# Patient Record
Sex: Female | Born: 1965 | Race: White | Hispanic: No | Marital: Married | Smoking: Never smoker
Health system: Southern US, Community
[De-identification: ages and names within clinical notes are randomized; demographics above are authoritative.]

## PROBLEM LIST (undated history)

## (undated) DIAGNOSIS — F329 Major depressive disorder, single episode, unspecified: Secondary | ICD-10-CM

## (undated) DIAGNOSIS — F419 Anxiety disorder, unspecified: Secondary | ICD-10-CM

## (undated) DIAGNOSIS — Z87440 Personal history of urinary (tract) infections: Secondary | ICD-10-CM

## (undated) DIAGNOSIS — N200 Calculus of kidney: Secondary | ICD-10-CM

## (undated) DIAGNOSIS — F32A Depression, unspecified: Secondary | ICD-10-CM

## (undated) DIAGNOSIS — G709 Myoneural disorder, unspecified: Secondary | ICD-10-CM

## (undated) DIAGNOSIS — M2550 Pain in unspecified joint: Secondary | ICD-10-CM

## (undated) DIAGNOSIS — R51 Headache: Secondary | ICD-10-CM

## (undated) HISTORY — PX: KNEE ARTHROSCOPY: SUR90

## (undated) HISTORY — PX: ABDOMINAL HYSTERECTOMY: SHX81

## (undated) HISTORY — PX: BREAST SURGERY: SHX581

---

## 1989-09-06 HISTORY — PX: CHOLECYSTECTOMY: SHX55

## 1990-09-06 DIAGNOSIS — G709 Myoneural disorder, unspecified: Secondary | ICD-10-CM

## 1990-09-06 HISTORY — DX: Myoneural disorder, unspecified: G70.9

## 2013-04-23 ENCOUNTER — Other Ambulatory Visit: Payer: Self-pay | Admitting: Anesthesiology

## 2013-04-25 ENCOUNTER — Encounter (HOSPITAL_COMMUNITY): Payer: Self-pay

## 2013-04-25 ENCOUNTER — Encounter (HOSPITAL_COMMUNITY)
Admission: RE | Admit: 2013-04-25 | Discharge: 2013-04-25 | Disposition: A | Discharge status: Home / Self Care | Payer: Managed Care, Other (non HMO) | Source: Ambulatory Visit | Attending: Anesthesiology | Admitting: Anesthesiology

## 2013-04-25 HISTORY — DX: Anxiety disorder, unspecified: F41.9

## 2013-04-25 HISTORY — DX: Pain in unspecified joint: M25.50

## 2013-04-25 HISTORY — DX: Headache: R51

## 2013-04-25 HISTORY — DX: Major depressive disorder, single episode, unspecified: F32.9

## 2013-04-25 HISTORY — DX: Myoneural disorder, unspecified: G70.9

## 2013-04-25 HISTORY — DX: Personal history of urinary (tract) infections: Z87.440

## 2013-04-25 HISTORY — DX: Depression, unspecified: F32.A

## 2013-04-25 HISTORY — DX: Calculus of kidney: N20.0

## 2013-04-25 LAB — BASIC METABOLIC PANEL
BUN: 13 mg/dL (ref 6–23)
CO2: 30 mEq/L (ref 19–32)
Calcium: 9.4 mg/dL (ref 8.4–10.5)
Chloride: 99 mEq/L (ref 96–112)
Creatinine, Ser: 0.64 mg/dL (ref 0.50–1.10)
GFR calc Af Amer: >90 mL/min (ref >90)
GFR calc non Af Amer: >90 mL/min (ref >90)
Glucose, Bld: 86 mg/dL (ref 70–99)
Potassium: 4.1 mEq/L (ref 3.5–5.1)
Sodium: 137 mEq/L (ref 135–145)

## 2013-04-25 LAB — SURGICAL PCR SCREEN
MRSA, PCR: NEGATIVE
Staphylococcus aureus: NEGATIVE

## 2013-04-25 LAB — CBC
HCT: 38.1 % (ref 36.0–46.0)
Hemoglobin: 13.3 g/dL (ref 12.0–15.0)
MCH: 30.9 pg (ref 26.0–34.0)
MCHC: 34.9 g/dL (ref 30.0–36.0)
MCV: 88.6 fL (ref 78.0–100.0)
Platelets: 198 10*3/uL (ref 150–400)
RBC: 4.3 MIL/uL (ref 3.87–5.11)
RDW: 12.5 % (ref 11.5–15.5)
WBC: 4.9 10*3/uL (ref 4.0–10.5)

## 2013-04-25 NOTE — Pre-Procedure Instructions (Signed)
Ashley Mcmahon  04/25/2013   Your procedure is scheduled on:  April 27, 2013 at 7:30 AM  Report to [facility] at 5:30 AM.  Call this number if you have problems the morning of surgery: [phone]   Remember:   Do not eat food or drink liquids after midnight.   Take these medicines the morning of surgery with A SIP OF WATER: Prozac, Xanax and Hydrocodone Stop all vitamins, herbal medications and any non-steroidals (Ibuprofen, Aleve) today.   Do not wear jewelry, make-up or nail polish.  Do not wear lotions, powders, or perfumes. You may wear deodorant.  Do not shave 48 hours prior to surgery. Men may shave face and neck.  Do not bring valuables to the hospital.  [facility] is not responsible                   for any belongings or valuables.  Contacts, dentures or bridgework may not be worn into surgery.  Leave suitcase in the car. After surgery it may be brought to your room.  For patients admitted to the hospital, checkout time is 11:00 AM the day of  discharge.   Patients discharged the day of surgery will not be allowed to drive  home.  Name and phone number of your driver: Nena Polio  Special Instructions: Shower using CHG 2 nights before surgery and the night before surgery.  If you shower the day of surgery use CHG.  Use special wash - you have one bottle of CHG for all showers.  You should use approximately 1/3 of the bottle for each shower.   Please read over the following fact sheets that you were given: Pain Booklet, Coughing and Deep Breathing, MRSA Information and Surgical Site Infection Prevention

## 2013-04-26 MED ORDER — CEFAZOLIN SODIUM-DEXTROSE 2-3 GM-% IV SOLR
2.0000 g | INTRAVENOUS | Status: AC
  Administered 2013-04-27: 2 g via INTRAVENOUS
  Filled 2013-04-26: qty 50

## 2013-04-26 NOTE — H&P (Signed)
Ashley Mcmahon is an 47 y.o. female.   Chief Complaint: pain in left lower extremity, CRPS HPI:47 year old with history of MS, but also CRPS of left foot/lower extremity. Failed interventional therapy after years of previous benefit, multiple medication trials. Some concern for being unable to MRI image head to evaluate MS, but with SCS now having limited MRI capability, and location of patient's pain favorable for lower thoracic placement of leads, patient underwent SCS trial with excellent results, including >50% improvement in pain control, and increased functionality. Now coming to OR for permanent implantation.   Previous placement of trial lead was as follows:  16 contact linear Infionion lead was introduced and seen to advance into the posterior elements of the epidural space.  Under live AP fluoroscopic guidance the lead was advanced until its distal 2 distal contacts overlay T10  with the rest of the contacts distributed across the T11 and T12 vertebral bodies.  The  lead was essentially midline.  Initial trials  resulted in good coverage using a variety of contacts, amplitude changes, pulse width manipulation to cover all the areas of the patient's pain.    Past Medical History  Diagnosis Date  . Neuromuscular disorder 1992    Multiple Sclerosis  . Depression     on Prozac  . Anxiety     on Xanax  . Kidney stone   . History of recurrent UTIs     has botox injections in bladder every 6-9 months  . Headache(784.0)     migraines, takes Excedrin Migraine as needed  . Joint pain     Past Surgical History  Procedure Laterality Date  . Abdominal hysterectomy    . Breast surgery Left     breast biopsy  . Cholecystectomy  1991  . Knee arthroscopy Right     No family history on file. Social History:  reports that she has never smoked. She has never used smokeless tobacco. She reports that  drinks alcohol. She reports that she does not use illicit drugs.  Allergies:  Allergies   Allergen Reactions  . Bactrim [Sulfamethoxazole-Tmp Ds] Hives  . Other Nausea Only    AGENT:  Coconut  . Sulfa Antibiotics Hives    No prescriptions prior to admission    Results for orders placed during the hospital encounter of 04/25/13 (from the past 48 hour(s))  SURGICAL PCR SCREEN     Status: None   Collection Time    04/25/13  8:51 AM      Result Value Range   MRSA, PCR NEGATIVE  NEGATIVE   Staphylococcus aureus NEGATIVE  NEGATIVE   Comment:            The Xpert SA Assay (FDA     approved for NASAL specimens     in patients over 69 years of age),     is one component of     a comprehensive surveillance     program.  Test performance has     been validated by [employer] for patients greater     than or equal to 4 year old.     It is not intended     to diagnose infection nor to     guide or monitor treatment.  CBC     Status: None   Collection Time    04/25/13  9:01 AM      Result Value Range   WBC 4.9  4.0 - 10.5 K/uL   RBC  4.30  3.87 - 5.11 MIL/uL   Hemoglobin 13.3  12.0 - 15.0 g/dL   HCT 08.6  57.8 - 46.9 %   MCV 88.6  78.0 - 100.0 fL   MCH 30.9  26.0 - 34.0 pg   MCHC 34.9  30.0 - 36.0 g/dL   RDW 62.9  52.8 - 41.3 %   Platelets 198  150 - 400 K/uL  BASIC METABOLIC PANEL     Status: None   Collection Time    04/25/13  9:02 AM      Result Value Range   Sodium 137  135 - 145 mEq/L   Potassium 4.1  3.5 - 5.1 mEq/L   Chloride 99  96 - 112 mEq/L   CO2 30  19 - 32 mEq/L   Glucose, Bld 86  70 - 99 mg/dL   BUN 13  6 - 23 mg/dL   Creatinine, Ser 2.44  0.50 - 1.10 mg/dL   Calcium 9.4  8.4 - 01.0 mg/dL   GFR calc non Af Amer >90  >90 mL/min   GFR calc Af Amer >90  >90 mL/min   Comment: (NOTE)     The eGFR has been calculated using the CKD EPI equation.     This calculation has not been validated in all clinical situations.     eGFR's persistently <90 mL/min signify possible Chronic Kidney     Disease.   No results found.  Review of Systems   Constitutional: Negative.   HENT: Negative.   Eyes: Negative.   Cardiovascular: Negative.   Gastrointestinal: Negative.   Genitourinary: Negative.   Musculoskeletal: Negative.   Skin: Negative.   Neurological: Negative.   Endo/Heme/Allergies: Negative.   Psychiatric/Behavioral: Negative.     There were no vitals taken for this visit. Physical Exam  Constitutional: She is oriented to person, place, and time. She appears well-developed and well-nourished.  HENT:  Head: Normocephalic and atraumatic.  Eyes: EOM are normal. Pupils are equal, round, and reactive to light.  Cardiovascular: Normal rate and regular rhythm.   Neurological: She is alert and oriented to person, place, and time.  Skin: Skin is warm and dry.  Psychiatric: She has a normal mood and affect. Her behavior is normal. Judgment and thought content normal.     Assessment/Plan A: CRPS left lower extremity, mild symptoms right PLAN: permanent Infinion/Spectra SCS implantation.   Gwynne Edinger 04/27/2013, 7:18 AM

## 2013-04-27 ENCOUNTER — Encounter (HOSPITAL_COMMUNITY)
Admission: RE | Disposition: A | Discharge status: Home / Self Care | Payer: Self-pay | Source: Ambulatory Visit | Attending: Anesthesiology

## 2013-04-27 ENCOUNTER — Ambulatory Visit (HOSPITAL_COMMUNITY)
Admission: RE | Admit: 2013-04-27 | Discharge: 2013-04-27 | Disposition: A | Discharge status: Home / Self Care | Payer: Managed Care, Other (non HMO) | Source: Ambulatory Visit | Attending: Anesthesiology | Admitting: Anesthesiology

## 2013-04-27 ENCOUNTER — Ambulatory Visit (HOSPITAL_COMMUNITY): Payer: Managed Care, Other (non HMO)

## 2013-04-27 ENCOUNTER — Ambulatory Visit (HOSPITAL_COMMUNITY): Payer: Managed Care, Other (non HMO) | Admitting: Certified Registered Nurse Anesthetist

## 2013-04-27 ENCOUNTER — Encounter (HOSPITAL_COMMUNITY): Payer: Self-pay | Admitting: Certified Registered Nurse Anesthetist

## 2013-04-27 ENCOUNTER — Encounter (HOSPITAL_COMMUNITY): Payer: Self-pay | Admitting: *Deleted

## 2013-04-27 DIAGNOSIS — F3289 Other specified depressive episodes: Secondary | ICD-10-CM | POA: Insufficient documentation

## 2013-04-27 DIAGNOSIS — Z87440 Personal history of urinary (tract) infections: Secondary | ICD-10-CM | POA: Insufficient documentation

## 2013-04-27 DIAGNOSIS — Z882 Allergy status to sulfonamides status: Secondary | ICD-10-CM | POA: Insufficient documentation

## 2013-04-27 DIAGNOSIS — G35 Multiple sclerosis: Secondary | ICD-10-CM | POA: Insufficient documentation

## 2013-04-27 DIAGNOSIS — G589 Mononeuropathy, unspecified: Secondary | ICD-10-CM | POA: Insufficient documentation

## 2013-04-27 DIAGNOSIS — F329 Major depressive disorder, single episode, unspecified: Secondary | ICD-10-CM | POA: Insufficient documentation

## 2013-04-27 DIAGNOSIS — F411 Generalized anxiety disorder: Secondary | ICD-10-CM | POA: Insufficient documentation

## 2013-04-27 DIAGNOSIS — G43909 Migraine, unspecified, not intractable, without status migrainosus: Secondary | ICD-10-CM | POA: Insufficient documentation

## 2013-04-27 DIAGNOSIS — M129 Arthropathy, unspecified: Secondary | ICD-10-CM | POA: Insufficient documentation

## 2013-04-27 DIAGNOSIS — Z91018 Allergy to other foods: Secondary | ICD-10-CM | POA: Insufficient documentation

## 2013-04-27 HISTORY — PX: SPINAL CORD STIMULATOR INSERTION: SHX5378

## 2013-04-27 LAB — PROTIME-INR
INR: 0.98 (ref 0.00–1.49)
Prothrombin Time: 12.8 seconds (ref 11.6–15.2)

## 2013-04-27 LAB — APTT: aPTT: 29 seconds (ref 24–37)

## 2013-04-27 SURGERY — INSERTION, SPINAL CORD STIMULATOR, LUMBAR
Anesthesia: Monitor Anesthesia Care | Site: Back | Wound class: Clean

## 2013-04-27 MED ORDER — MIDAZOLAM HCL 5 MG/5ML IJ SOLN
INTRAMUSCULAR | Status: DC | PRN
  Administered 2013-04-27: 2 mg via INTRAVENOUS

## 2013-04-27 MED ORDER — HYDROCODONE-ACETAMINOPHEN 10-325 MG PO TABS: 1.0000 | ORAL_TABLET | Freq: Four times a day (QID) | ORAL | Status: DC | PRN

## 2013-04-27 MED ORDER — 0.9 % SODIUM CHLORIDE (POUR BTL) OPTIME
TOPICAL | Status: DC | PRN
  Administered 2013-04-27: 1000 mL

## 2013-04-27 MED ORDER — LACTATED RINGERS IV SOLN
INTRAVENOUS | Status: DC | PRN
  Administered 2013-04-27 (×2): via INTRAVENOUS

## 2013-04-27 MED ORDER — FENTANYL CITRATE 0.05 MG/ML IJ SOLN
INTRAMUSCULAR | Status: DC | PRN
  Administered 2013-04-27: 25 ug via INTRAVENOUS

## 2013-04-27 MED ORDER — LACTATED RINGERS IV SOLN: INTRAVENOUS | Status: DC

## 2013-04-27 MED ORDER — CEPHALEXIN 500 MG PO CAPS: 500.0000 mg | ORAL_CAPSULE | Freq: Four times a day (QID) | ORAL | Status: DC

## 2013-04-27 MED ORDER — HYDROMORPHONE HCL PF 1 MG/ML IJ SOLN
INTRAMUSCULAR | Status: AC
  Filled 2013-04-27: qty 1

## 2013-04-27 MED ORDER — PROPOFOL INFUSION 10 MG/ML OPTIME
INTRAVENOUS | Status: DC | PRN
  Administered 2013-04-27: 120 ug/kg/min via INTRAVENOUS

## 2013-04-27 MED ORDER — BUPIVACAINE-EPINEPHRINE (PF) 0.5% -1:200000 IJ SOLN
INTRAMUSCULAR | Status: DC | PRN
  Administered 2013-04-27: 22 mL

## 2013-04-27 MED ORDER — ONDANSETRON HCL 4 MG/2ML IJ SOLN: 4.0000 mg | Freq: Once | INTRAMUSCULAR | Status: DC | PRN

## 2013-04-27 MED ORDER — EPHEDRINE SULFATE 50 MG/ML IJ SOLN
INTRAMUSCULAR | Status: DC | PRN
  Administered 2013-04-27: 10 mg via INTRAVENOUS

## 2013-04-27 MED ORDER — HYDROMORPHONE HCL PF 1 MG/ML IJ SOLN
0.2500 mg | INTRAMUSCULAR | Status: DC | PRN
  Administered 2013-04-27: 0.5 mg via INTRAVENOUS

## 2013-04-27 MED ORDER — SODIUM CHLORIDE 0.9 % IR SOLN
Status: DC | PRN
  Administered 2013-04-27: 08:00:00

## 2013-04-27 SURGICAL SUPPLY — 65 items
BAG DECANTER FOR FLEXI CONT (MISCELLANEOUS) ×2 IMPLANT
BENZOIN TINCTURE PRP APPL 2/3 (GAUZE/BANDAGES/DRESSINGS) IMPLANT
BINDER ABD UNIV 12 45-62 (WOUND CARE) IMPLANT
BINDER ABDOMINAL 46IN 62IN (WOUND CARE)
BLADE SURG ROTATE 9660 (MISCELLANEOUS) IMPLANT
CABLE/EXTENSION OR 1X16 61 (CABLE) ×4 IMPLANT
CHLORAPREP W/TINT 26ML (MISCELLANEOUS) ×2 IMPLANT
CLOTH BEACON ORANGE TIMEOUT ST (SAFETY) ×2 IMPLANT
CONT SPEC 4OZ CLIKSEAL STRL BL (MISCELLANEOUS) ×2 IMPLANT
DERMABOND ADHESIVE PROPEN (GAUZE/BANDAGES/DRESSINGS) ×1
DERMABOND ADVANCED .7 DNX6 (GAUZE/BANDAGES/DRESSINGS) ×1 IMPLANT
DRAPE C-ARM 42X72 X-RAY (DRAPES) ×2 IMPLANT
DRAPE C-ARMOR (DRAPES) ×2 IMPLANT
DRAPE LAPAROTOMY 100X72X124 (DRAPES) ×2 IMPLANT
DRAPE POUCH INSTRU U-SHP 10X18 (DRAPES) ×2 IMPLANT
DRAPE SURG 17X23 STRL (DRAPES) ×2 IMPLANT
DRESSING TELFA 8X3 (GAUZE/BANDAGES/DRESSINGS) IMPLANT
DRSG COVADERM 4X8 (GAUZE/BANDAGES/DRESSINGS) ×4 IMPLANT
DRSG OPSITE 4X5.5 SM (GAUZE/BANDAGES/DRESSINGS) ×4 IMPLANT
ELECT REM PT RETURN 9FT ADLT (ELECTROSURGICAL) ×2
ELECTRODE REM PT RTRN 9FT ADLT (ELECTROSURGICAL) ×1 IMPLANT
GAUZE SPONGE 4X4 16PLY XRAY LF (GAUZE/BANDAGES/DRESSINGS) ×4 IMPLANT
GLOVE BIOGEL PI IND STRL 7.0 (GLOVE) ×1 IMPLANT
GLOVE BIOGEL PI INDICATOR 7.0 (GLOVE) ×1
GLOVE ECLIPSE 7.5 STRL STRAW (GLOVE) ×2 IMPLANT
GLOVE EXAM NITRILE LRG STRL (GLOVE) IMPLANT
GLOVE EXAM NITRILE MD LF STRL (GLOVE) IMPLANT
GLOVE EXAM NITRILE XL STR (GLOVE) IMPLANT
GLOVE EXAM NITRILE XS STR PU (GLOVE) IMPLANT
GLOVE SURG SS PI 7.0 STRL IVOR (GLOVE) ×4 IMPLANT
GOWN BRE IMP SLV AUR LG STRL (GOWN DISPOSABLE) IMPLANT
GOWN BRE IMP SLV AUR XL STRL (GOWN DISPOSABLE) ×4 IMPLANT
GOWN STRL REIN 2XL LVL4 (GOWN DISPOSABLE) IMPLANT
IPG PRECISION SPECTRA (Stimulator) ×2 IMPLANT
KIT BASIN OR (CUSTOM PROCEDURE TRAY) ×2 IMPLANT
KIT CHARGING (KITS) ×1
KIT CHARGING PRECISION NEURO (KITS) ×1 IMPLANT
KIT REMOTE CONTROL PRECISION (KITS) ×2 IMPLANT
KIT ROOM TURNOVER OR (KITS) ×2 IMPLANT
KIT SPLITTER 30CM 2X8 (Stimulator) ×4 IMPLANT
LEAD KIT CONTACT INFINION 16 (Stimulator) ×4 IMPLANT
LOOP VESSEL MAXI BLUE (MISCELLANEOUS) ×2 IMPLANT
LOOP VESSEL MINI RED (MISCELLANEOUS) ×2 IMPLANT
MEDICAL ADHESIVE ×2 IMPLANT
NEEDLE 18GX1X1/2 (RX/OR ONLY) (NEEDLE) IMPLANT
NEEDLE HYPO 25X1 1.5 SAFETY (NEEDLE) ×2 IMPLANT
NS IRRIG 1000ML POUR BTL (IV SOLUTION) ×2 IMPLANT
PACK LAMINECTOMY NEURO (CUSTOM PROCEDURE TRAY) ×2 IMPLANT
PAD ARMBOARD 7.5X6 YLW CONV (MISCELLANEOUS) ×2 IMPLANT
SPONGE LAP 4X18 X RAY DECT (DISPOSABLE) ×2 IMPLANT
SPONGE SURGIFOAM ABS GEL SZ50 (HEMOSTASIS) IMPLANT
STAPLER SKIN PROX WIDE 3.9 (STAPLE) IMPLANT
STRIP CLOSURE SKIN 1/2X4 (GAUZE/BANDAGES/DRESSINGS) IMPLANT
SUT MNCRL AB 3-0 PS2 18 (SUTURE) IMPLANT
SUT MNCRL AB 4-0 PS2 18 (SUTURE) ×4 IMPLANT
SUT SILK 0 (SUTURE) ×2
SUT SILK 0 MO-6 18XCR BRD 8 (SUTURE) ×2 IMPLANT
SUT SILK 0 TIES 10X30 (SUTURE) IMPLANT
SUT SILK 2 0 TIES 10X30 (SUTURE) ×2 IMPLANT
SUT VIC AB 2-0 CP2 18 (SUTURE) ×4 IMPLANT
SYRINGE 10CC LL (SYRINGE) IMPLANT
TOWEL OR 17X24 6PK STRL BLUE (TOWEL DISPOSABLE) ×2 IMPLANT
TOWEL OR 17X26 10 PK STRL BLUE (TOWEL DISPOSABLE) ×2 IMPLANT
WATER STERILE IRR 1000ML POUR (IV SOLUTION) ×2 IMPLANT
YANKAUER SUCT BULB TIP NO VENT (SUCTIONS) ×2 IMPLANT

## 2013-04-27 NOTE — Op Note (Signed)
PREOP DX: CRPS of the left lower extremity POSTOP DX: CRPS of the left lower extremity PROCEDURES PERFORMED:1) intraop fluoro 2) placement of 2 16 contact [employer] Infinion leads 3) placement of Spectra SCS generator  SURGEON:Mandy Peeks  ASSISTANT: NONE  ANESTHESIA: MAC  EBL: <20cc  DESCRIPTION OF PROCEDURE: After a discussion of risks, benefits and alternatives, informed consent was obtained. The patient was taken to the OR, turned prone onto a Jackson table, all pressure points padded, SCD's placed, and an adequate plane of anesthesia induced. A timeout was taken to verify the correct patient, position, personnel, availability of appropriate equipment, and administration of perioperative antibiotics.  The thoracic and lumbar areas were widely prepped with chloraprep and draped into a sterile field. Fluoroscopy was used to plan a right paramedian incision at the L1-L3 levels, and an incision made with a 10 blade and carried down to the dorsolumbar fascia with the bovie and blunt dissection. Retractors were placed and a 14g [employer] tuohy needle placed into the epidural space at the L1-2  interspace using biplanar fluoro and loss-of-resistance technique. The needle was aspirated without any return of fluid. A [employer] INFINION lead was introduced and under live AP fluoro advanced until the distal-most contact overlay the inferior aspect T8 vertebral body shadow with the rest of the contacts distributed over the T9 and T10 vertebral bodies in a position just at anatomic midline. A second Infinion lead was placed just left of anatomic midline in the same levels using the same technique. The patient was awakened and the leads tested; impedances were good, and the patient reported good coverage with amplitudes in the 3-7 mA range. 0 silk sutures were placed in the fascia adjacent to the needles. The needles and stylets were removed under fluoroscopy with no lead migration noted. Leads  were then fixed to the fascia by chest tube-type fixation into position with the sutures; repeat images were obtained to verify that there had been no lead migration.   The incision was inspected and hemostasis obtained with the bipolar cautery.  Attention was then turned to creation of a subcutaneous pocket. At the left flank, a 3 cm incision was made with a 10 blade and using the bovie and blunt dissection a pocket of size appropriate to place a SCS generator. The pocket was trialed, and found to be of adequate size. The pocket was inspected for hemostasis, which was found to be excellent. Using reverse seldinger technique, the leads were tunneled to the pocket site, and the leads inserted into the SCS generator. Impedances were checked, and all found to be excellent. The leads were then all fixed into position with a self-torquing wrench. The wiring was all carefully coiled, placed behind the generator and placed in the pocket.  Both incisions were copiously irrigated with bacitracin-containing irrigation. The lumbar incision was closed in 2 deep layers of interrupted 2-0 vicryl and the skin closed with a running 3-0 monocryl subcuticular suture, and dermabond. The pocket incision was closed with a deeper layer of 2-0 vicryl interrupted sutures, and the skin closed with running 3-0 monocryl subcuticular suture, and dermabond. Sterile dressings were applied. Needle, sponge, and instrument counts were correct x2 at the end of the case.   The patient was then carefully awakened from anesthesia, turned supine, an abdominal binder placed, and the patient taken to the recovery room where she underwent complex spinal cord stimulator programming.  COMPLICATIONS: NONE  CONDITION: Stable throughout the course of the procedure and immediately afterward  DISPOSITION: discharge to home, with antibiotics and pain medicine. Discussed care with the patient and her husband. Followup in clinic will be scheduled in 10-14  days.

## 2013-04-27 NOTE — Transfer of Care (Signed)
Immediate Anesthesia Transfer of Care Note  Patient: Ashley Mcmahon  Procedure(s) Performed: Procedure(s): LUMBAR SPINAL CORD STIMULATOR INSERTION (N/A)  Patient Location: PACU  Anesthesia Type:MAC  Level of Consciousness: awake, alert , oriented and patient cooperative  Airway & Oxygen Therapy: Patient Spontanous Breathing and Patient connected to nasal cannula oxygen  Post-op Assessment: Report given to PACU RN, Post -op Vital signs reviewed and stable and Patient moving all extremities X 4  Post vital signs: Reviewed and stable  Complications: No apparent anesthesia complications

## 2013-04-27 NOTE — Anesthesia Preprocedure Evaluation (Addendum)
Anesthesia Evaluation  Patient identified by MRN, date of birth, ID band Patient awake    Reviewed: Allergy & Precautions, H&P , NPO status   Airway Mallampati: II      Dental  (+) Teeth Intact and Dental Advisory Given   Pulmonary  breath sounds clear to auscultation        Cardiovascular Rhythm:Regular Rate:Normal     Neuro/Psych  Headaches, Anxiety Depression  Neuromuscular disease    GI/Hepatic   Endo/Other    Renal/GU      Musculoskeletal   Abdominal   Peds  Hematology   Anesthesia Other Findings   Reproductive/Obstetrics                          Anesthesia Physical Anesthesia Plan  ASA: II  Anesthesia Plan: MAC   Post-op Pain Management:    Induction: Intravenous  Airway Management Planned: Natural Airway and Simple Face Mask  Additional Equipment:   Intra-op Plan:   Post-operative Plan:   Informed Consent: I have reviewed the patients History and Physical, chart, labs and discussed the procedure including the risks, benefits and alternatives for the proposed anesthesia with the patient or authorized representative who has indicated his/her understanding and acceptance.   Dental advisory given  Plan Discussed with: CRNA, Anesthesiologist and Surgeon  Anesthesia Plan Comments: (RSD L LE Multiple sclerosis  No obvious sequelae  Plan MAC  Kipp Brood, MD)       Anesthesia Quick Evaluation

## 2013-04-27 NOTE — Preoperative (Signed)
Beta Blockers   Reason not to administer Beta Blockers:Not Applicable 

## 2013-04-27 NOTE — Anesthesia Postprocedure Evaluation (Signed)
  Anesthesia Post-op Note  Patient: Ashley Mcmahon  Procedure(s) Performed: Procedure(s): LUMBAR SPINAL CORD STIMULATOR INSERTION (N/A)  Patient Location: PACU  Anesthesia Type:MAC  Level of Consciousness: awake, alert  and oriented  Airway and Oxygen Therapy: Patient Spontanous Breathing  Post-op Pain: none  Post-op Assessment: Post-op Vital signs reviewed, Patient's Cardiovascular Status Stable, Respiratory Function Stable, Patent Airway and Pain level controlled  Post-op Vital Signs: stable  Complications: No apparent anesthesia complications

## 2013-04-30 ENCOUNTER — Encounter (HOSPITAL_COMMUNITY): Payer: Self-pay | Admitting: Anesthesiology

## 2013-11-30 DIAGNOSIS — G35 Multiple sclerosis: Secondary | ICD-10-CM | POA: Insufficient documentation

## 2016-01-08 ENCOUNTER — Other Ambulatory Visit: Payer: Self-pay | Admitting: Psychiatry

## 2016-01-08 DIAGNOSIS — I050 Rheumatic mitral stenosis: Secondary | ICD-10-CM

## 2017-11-16 DIAGNOSIS — N3946 Mixed incontinence: Secondary | ICD-10-CM | POA: Insufficient documentation

## 2017-11-16 DIAGNOSIS — N319 Neuromuscular dysfunction of bladder, unspecified: Secondary | ICD-10-CM | POA: Insufficient documentation

## 2018-02-02 DIAGNOSIS — N3281 Overactive bladder: Secondary | ICD-10-CM | POA: Insufficient documentation

## 2019-11-20 ENCOUNTER — Encounter (HOSPITAL_COMMUNITY): Payer: Self-pay | Admitting: Emergency Medicine

## 2019-11-20 ENCOUNTER — Emergency Department (HOSPITAL_COMMUNITY)
Admission: EM | Admit: 2019-11-20 | Discharge: 2019-11-21 | Discharge status: Against Medical Advice | Payer: 59 | Attending: Emergency Medicine | Admitting: Emergency Medicine

## 2019-11-20 ENCOUNTER — Emergency Department (HOSPITAL_COMMUNITY): Payer: 59

## 2019-11-20 ENCOUNTER — Other Ambulatory Visit: Payer: Self-pay

## 2019-11-20 DIAGNOSIS — Z5321 Procedure and treatment not carried out due to patient leaving prior to being seen by health care provider: Secondary | ICD-10-CM | POA: Insufficient documentation

## 2019-11-20 DIAGNOSIS — R002 Palpitations: Secondary | ICD-10-CM | POA: Diagnosis not present

## 2019-11-20 DIAGNOSIS — R42 Dizziness and giddiness: Secondary | ICD-10-CM | POA: Diagnosis not present

## 2019-11-20 DIAGNOSIS — M79603 Pain in arm, unspecified: Secondary | ICD-10-CM | POA: Diagnosis not present

## 2019-11-20 LAB — BASIC METABOLIC PANEL
Anion gap: 14 (ref 5–15)
BUN: 26 mg/dL — ABNORMAL HIGH (ref 6–20)
CO2: 23 mmol/L (ref 22–32)
Calcium: 9.6 mg/dL (ref 8.9–10.3)
Chloride: 100 mmol/L (ref 98–111)
Creatinine, Ser: 0.91 mg/dL (ref 0.44–1.00)
GFR calc Af Amer: >60 mL/min (ref >60)
GFR calc non Af Amer: >60 mL/min (ref >60)
Glucose, Bld: 93 mg/dL (ref 70–99)
Potassium: 3.7 mmol/L (ref 3.5–5.1)
Sodium: 137 mmol/L (ref 135–145)

## 2019-11-20 LAB — CBC
HCT: 38.8 % (ref 36.0–46.0)
Hemoglobin: 13.7 g/dL (ref 12.0–15.0)
MCH: 30.4 pg (ref 26.0–34.0)
MCHC: 35.3 g/dL (ref 30.0–36.0)
MCV: 86 fL (ref 80.0–100.0)
Platelets: 319 10*3/uL (ref 150–400)
RBC: 4.51 MIL/uL (ref 3.87–5.11)
RDW: 11.4 % — ABNORMAL LOW (ref 11.5–15.5)
WBC: 9.9 10*3/uL (ref 4.0–10.5)
nRBC: 0 % (ref 0.0–0.2)

## 2019-11-20 LAB — TROPONIN I (HIGH SENSITIVITY): Troponin I (High Sensitivity): 4 ng/L (ref <18)

## 2019-11-20 MED ORDER — SODIUM CHLORIDE 0.9% FLUSH: 3.0000 mL | Freq: Once | INTRAVENOUS | Status: DC

## 2019-11-20 NOTE — ED Triage Notes (Signed)
Pt arrives to ED from home with complaints of sudden right arm pain/numbness, back pain, right rib pain and dizziness starting today at 10am. Patient states she doesn't "fee right, something is off."

## 2019-11-20 NOTE — ED Notes (Signed)
Pt. Was called multiple times, 5x no response, moved OTF.

## 2020-03-11 ENCOUNTER — Ambulatory Visit (INDEPENDENT_AMBULATORY_CARE_PROVIDER_SITE_OTHER): Payer: No Typology Code available for payment source | Admitting: Podiatry

## 2020-03-11 ENCOUNTER — Ambulatory Visit (INDEPENDENT_AMBULATORY_CARE_PROVIDER_SITE_OTHER): Payer: No Typology Code available for payment source

## 2020-03-11 ENCOUNTER — Encounter: Payer: Self-pay | Admitting: Podiatry

## 2020-03-11 ENCOUNTER — Other Ambulatory Visit: Payer: Self-pay | Admitting: Podiatry

## 2020-03-11 ENCOUNTER — Other Ambulatory Visit: Payer: Self-pay

## 2020-03-11 DIAGNOSIS — G90523 Complex regional pain syndrome I of lower limb, bilateral: Secondary | ICD-10-CM | POA: Diagnosis not present

## 2020-03-11 DIAGNOSIS — M79671 Pain in right foot: Secondary | ICD-10-CM | POA: Diagnosis not present

## 2020-03-11 DIAGNOSIS — M778 Other enthesopathies, not elsewhere classified: Secondary | ICD-10-CM

## 2020-03-11 DIAGNOSIS — M7752 Other enthesopathy of left foot: Secondary | ICD-10-CM

## 2020-03-11 NOTE — Progress Notes (Signed)
Subjective:  Patient ID: Ashley Mcmahon, female    DOB: March 27, 1966,  MRN: [id number] HPI Chief Complaint  Patient presents with  . Foot Pain    Bilateral feet (L>R) - "crunching bones" type feeling, very sore with walking, skin is very sensitive, worse wtih barefoot, has CRPS in left foot, has stimulator x 7 years, but unsure if helping anymore, both feet seem to be affected now, swelling, worsened over the last 2 years  . New Patient (Initial Visit)    54 y.o. female presents with the above complaint.   ROS: Denies fever chills nausea vomiting muscle aches pains calf pain back pain chest pain shortness of breath.  She does relate a history of CRPS with nerve stimulator bilateral legs states that the nerve stimulator appears to not be working as well as it once did.  States that she has pain beneath the hallux interphalangeal joint right foot that feels the way her CRPS started several years ago.  Past Medical History:  Diagnosis Date  . Anxiety    on Xanax  . Depression    on Prozac  . Headache(784.0)    migraines, takes Excedrin Migraine as needed  . History of recurrent UTIs    has botox injections in bladder every 6-9 months  . Joint pain   . Kidney stone   . Neuromuscular disorder (HCC) 1992   Multiple Sclerosis   Past Surgical History:  Procedure Laterality Date  . ABDOMINAL HYSTERECTOMY    . BREAST SURGERY Left    breast biopsy  . CHOLECYSTECTOMY  1991  . KNEE ARTHROSCOPY Right   . SPINAL CORD STIMULATOR INSERTION N/A 04/27/2013   Procedure: LUMBAR SPINAL CORD STIMULATOR INSERTION;  Surgeon: Gwynne Edinger, MD;  Location: [facility];  Service: Neurosurgery;  Laterality: N/A;    Current Outpatient Medications:  .  ALPRAZolam (XANAX) 1 MG tablet, Take 1 mg by mouth 3 (three) times daily as needed for anxiety., Disp: , Rfl:  .  amLODipine (NORVASC) 5 MG tablet, Take 5 mg by mouth daily., Disp: , Rfl:  .  amphetamine-dextroamphetamine (ADDERALL) 30 MG tablet, Take 1  tablet by mouth daily., Disp: , Rfl:  .  cephALEXin (KEFLEX) 500 MG capsule, Take 500 mg by mouth at bedtime., Disp: , Rfl:  .  estradiol (ESTRACE) 1 MG tablet, Take 1 mg by mouth daily., Disp: , Rfl:  .  losartan-hydrochlorothiazide (HYZAAR) 100-25 MG tablet, Take 1 tablet by mouth daily., Disp: , Rfl:  .  omeprazole (PRILOSEC) 20 MG capsule, Take 20 mg by mouth daily., Disp: , Rfl:  .  rosuvastatin (CRESTOR) 10 MG tablet, Take 10 mg by mouth daily., Disp: , Rfl:  .  tiZANidine (ZANAFLEX) 4 MG capsule, TAKE 1 CAPSULE BY MOUTH EVERYDAY AT BEDTIME, Disp: , Rfl:  .  Venlafaxine HCl 150 MG TB24, Take 1 tablet by mouth daily., Disp: , Rfl:  .  ZEPOSIA 0.92 MG CAPS, Take 1 capsule by mouth at bedtime., Disp: , Rfl:  .  zolpidem (AMBIEN) 10 MG tablet, Take 10 mg by mouth at bedtime as needed for sleep., Disp: , Rfl:   Allergies  Allergen Reactions  . Bactrim [Sulfamethoxazole-Trimethoprim] Hives  . Other Nausea Only    AGENT:  Coconut  . Sulfa Antibiotics Hives  . Trimethoprim    Review of Systems Objective:  There were no vitals filed for this visit.  General: Well developed, nourished, in no acute distress, alert and oriented x3   Dermatological: Skin is warm, dry and  supple bilateral. Nails x 10 are well maintained; remaining integument appears unremarkable at this time. There are no open sores, no preulcerative lesions, no rash or signs of infection present.  Vascular: Dorsalis Pedis artery and Posterior Tibial artery pedal pulses are 2/4 bilateral with immedate capillary fill time. Pedal hair growth present. No varicosities and no lower extremity edema present bilateral.   Neruologic: Grossly intact via light touch bilateral. Vibratory intact via tuning fork bilateral. Protective threshold with Semmes Wienstein monofilament intact to all pedal sites bilateral. Patellar and Achilles deep tendon reflexes 2+ bilateral. No Babinski or clonus noted bilateral.  Severe allodynic type pain on  palpation of the skin bilaterally.  She also has a small bursa beneath the hallux interphalangeal joint which may be resulting in some neuritis of the right hallux.  This just is easily can be early CRPS.  Musculoskeletal: No gross boney pedal deformities bilateral. No pain, crepitus, or limitation noted with foot and ankle range of motion bilateral. Muscular strength 5/5 in all groups tested bilateral.  Mildly swollen left foot with contracture of those toes.  She also has a history of MS which could explain this as well.  Gait: Unassisted, Nonantalgic.    Radiographs:  Radiographs right foot taken today demonstrate normal osseous architecture no acute findings are noted.  Osseously mature individual mild osteopenia.  Assessment & Plan:   Assessment: Neuritis bursitis interphalangeal joint of the hallux right.  CRPS left lower extremity controlled with narcotics and spinal cord stimulator.  Plan: Injected the area today hallux interphalangeal joint with dexamethasone local anesthetic.  Tolerated procedure well without complications.  Follow-up with her in about 6 weeks.  Informed her that she should follow-up with neurosurgery once again.     Ersel Enslin T. [place], [place]

## 2021-05-13 IMAGING — CR DG CHEST 2V
2 series · 2 of 2 positions shown · non-contrast
Comparison: None.

CLINICAL DATA: Right shoulder and right arm tingling and numbness.

EXAM:
CHEST - 2 VIEW

[chest pa]
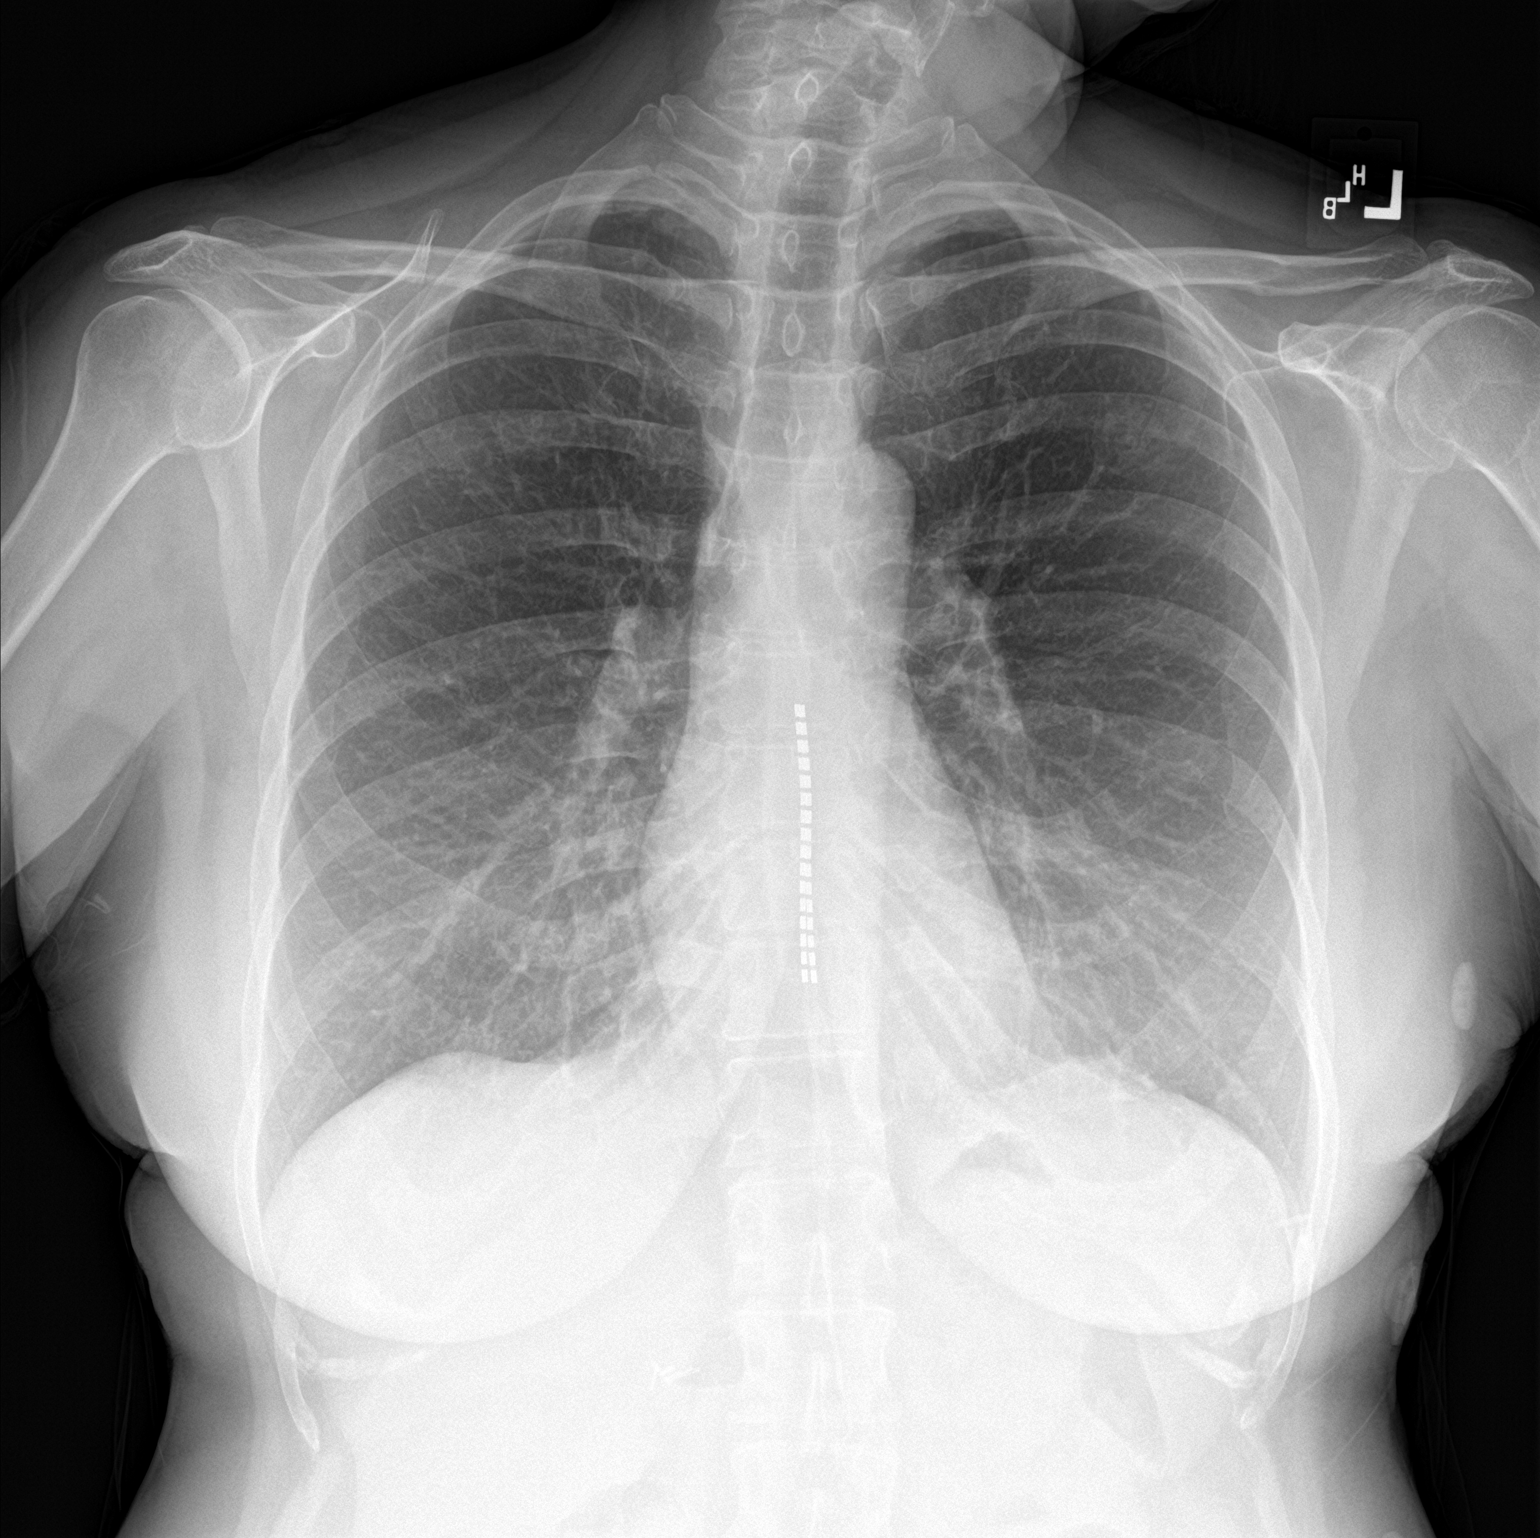

[chest lat]
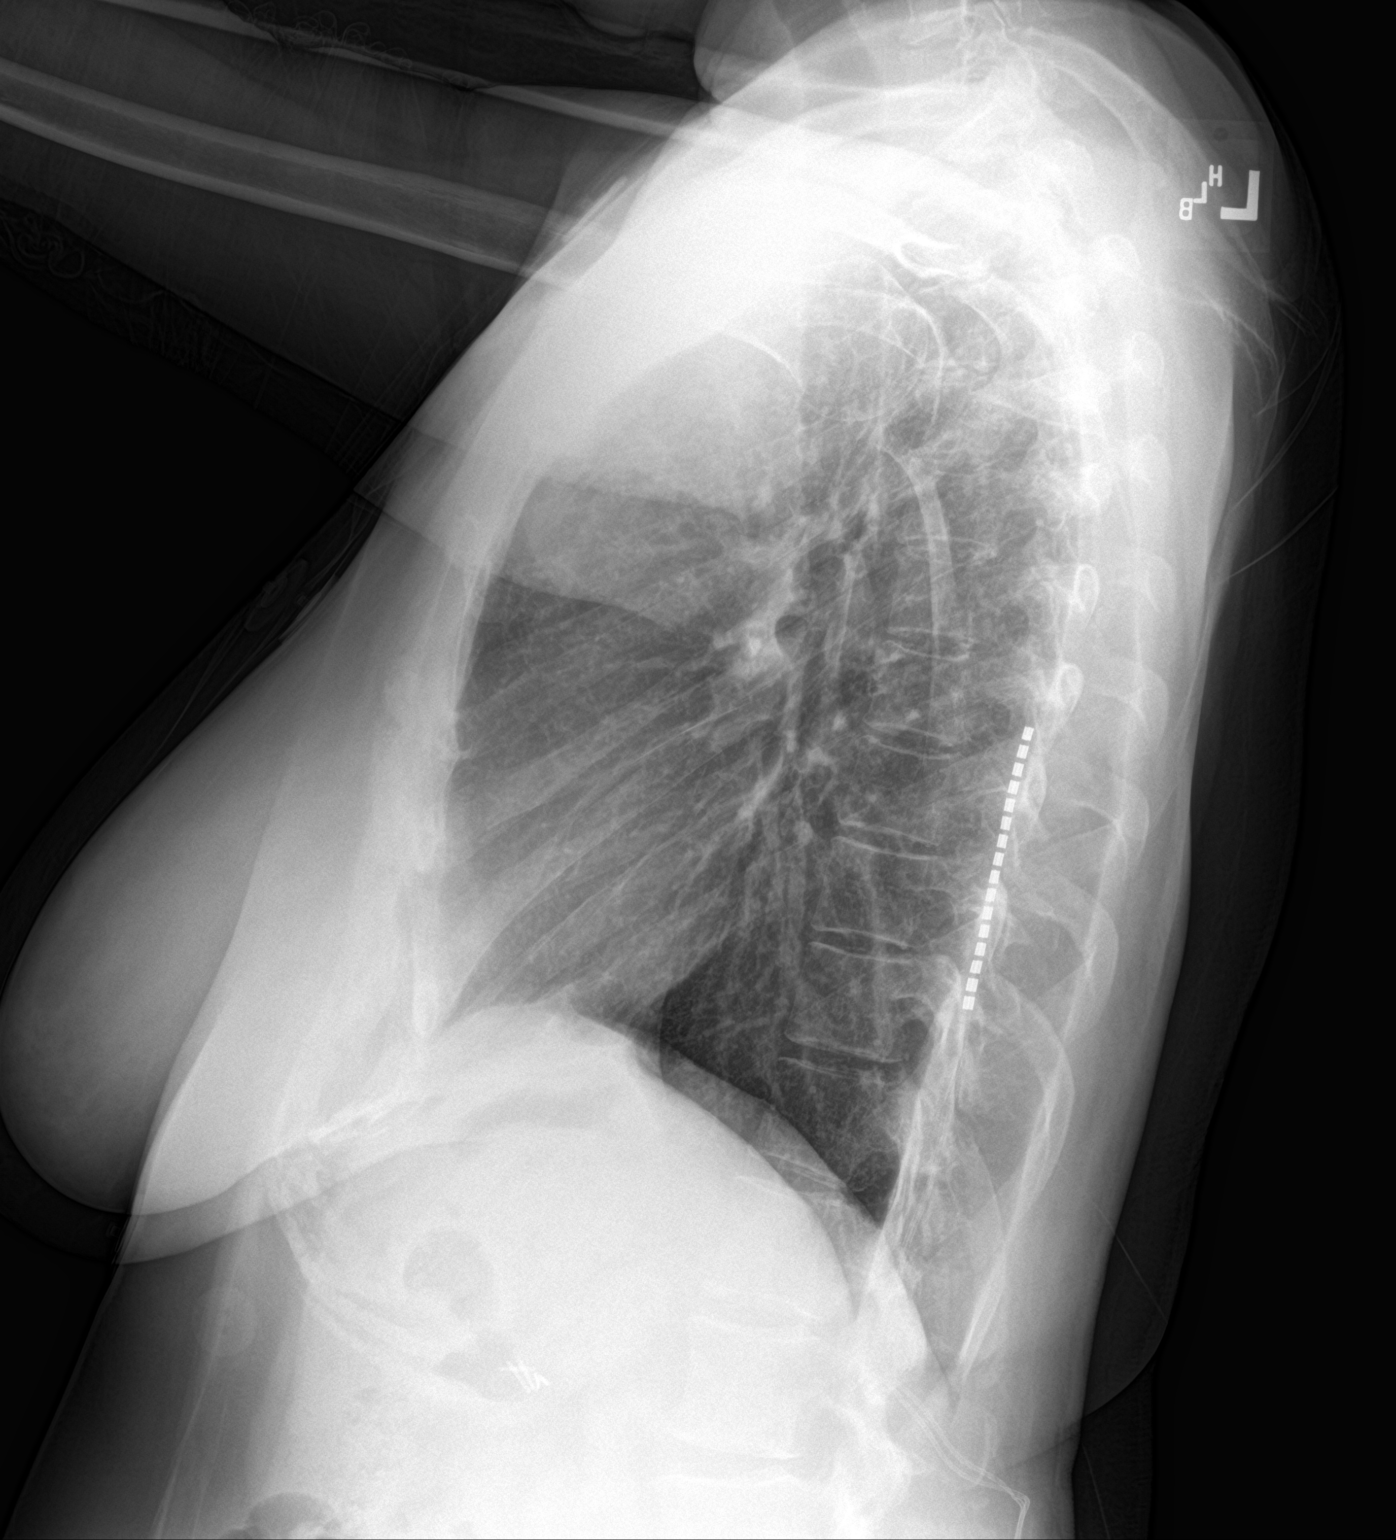

[2 of 2 positions shown; findings below may reference images not displayed]

FINDINGS: The heart size and mediastinal contours are within normal limits.
Both lungs are clear. The radiopaque spinal stimulator wire is seen
with its distal tip overlying the T7 vertebral body. The visualized
skeletal structures are unremarkable. Radiopaque surgical clips are
seen overlying the right upper quadrant.
IMPRESSION: No active cardiopulmonary disease.
